# Patient Record
Sex: Female | Born: 1955 | Race: Black or African American | Hispanic: No | Smoking: Never smoker
Health system: Southern US, Community
[De-identification: ages and names within clinical notes are randomized; demographics above are authoritative.]

---

## 2020-08-12 ENCOUNTER — Encounter (HOSPITAL_BASED_OUTPATIENT_CLINIC_OR_DEPARTMENT_OTHER): Payer: Self-pay | Admitting: Emergency Medicine

## 2020-08-12 ENCOUNTER — Emergency Department (HOSPITAL_BASED_OUTPATIENT_CLINIC_OR_DEPARTMENT_OTHER)
Admission: EM | Admit: 2020-08-12 | Discharge: 2020-08-12 | Disposition: A | Discharge status: Home / Self Care | Payer: Self-pay | Attending: Emergency Medicine | Admitting: Emergency Medicine

## 2020-08-12 ENCOUNTER — Other Ambulatory Visit: Payer: Self-pay

## 2020-08-12 ENCOUNTER — Emergency Department (HOSPITAL_BASED_OUTPATIENT_CLINIC_OR_DEPARTMENT_OTHER): Payer: Self-pay

## 2020-08-12 DIAGNOSIS — M25562 Pain in left knee: Secondary | ICD-10-CM | POA: Insufficient documentation

## 2020-08-12 NOTE — Discharge Instructions (Signed)
You have been seen today for knee pain. There were no acute abnormalities on the x-rays, including no sign of fracture or dislocation, however, there could be injuries to the soft tissues, such as the ligaments or tendons that are not seen on xrays.  Ibuprofen: Take 400-600 mg of ibuprofen every 6-8 hours for the next 3 days. After this time, this medication may be used as needed for pain. Take this medication with food to avoid upset stomach. Acetaminophen (generic for Tylenol): Should you continue to have additional pain while taking the ibuprofen you may add in acetaminophen as needed. Your daily total maximum amount of acetaminophen from all sources should be limited to 4000mg /day for persons without liver problems, or 2000mg /day for those with liver problems. Ice: May apply ice to the area over the next 24 hours for 15 minutes at a time to reduce swelling. Elevation: Keep the extremity elevated as often as possible to reduce pain and inflammation. Support: Wear the knee brace for support and comfort. Wear this until pain resolves.  Follow up: Follow-up with the orthopedic specialist for any further management of this issue, ideally within the next 2 weeks. Return: Return to the ED for numbness, weakness, increasing pain, overall worsening symptoms, loss of function, or if symptoms are not improving, you have tried to follow up with the orthopedic specialist, and have been unable to do so.  For prescription assistance, may try using prescription discount sites or apps, such as goodrx.com or Good Rx smart phone app.

## 2020-08-12 NOTE — ED Triage Notes (Signed)
Pt states her left knee started tingling 2 weeks ago. She states she starting having pain last Sunday and couldn't bear weight on it. Pt wearing knee brace. Knee swollen and warm to touch with no distal symptoms. Denies numbness, fevers.

## 2020-08-12 NOTE — ED Provider Notes (Signed)
[facility] EMERGENCY DEPARTMENT Provider Note   CSN: [id number] Arrival date & time: 08/12/20  9528     History Chief Complaint  Patient presents with  . Knee Pain    Alicia Wyatt is a 64 y.o. female.  HPI      Alicia Wyatt is a 64 y.o. female, patient with no diagnosed past medical history, presenting to the ED with left knee pain for at least the last 2 weeks.  She notes the pain is aching, located along the anterior and medial aspects of the knee, nonradiating from the knee, moderate to severe.  Pain and swelling improve with rest and elevation.  Denies other extremity swelling, erythema, fever, recent procedures, recent injury, numbness, weakness, shortness of breath, chest pain, or any other complaints.  History reviewed. No pertinent past medical history.  There are no problems to display for this patient.   History reviewed. No pertinent surgical history.   OB History   No obstetric history on file.     No family history on file.  Social History   Tobacco Use  . Smoking status: Not on file  Substance Use Topics  . Alcohol use: Never  . Drug use: Never    Home Medications Prior to Admission medications   Not on File    Allergies    Patient has no known allergies.  Review of Systems   Review of Systems  Constitutional: Negative for fever.  Respiratory: Negative for shortness of breath.   Cardiovascular: Negative for chest pain.  Musculoskeletal: Positive for arthralgias and joint swelling. Negative for back pain.  Skin: Negative for color change.  Neurological: Negative for weakness and numbness.    Physical Exam Updated Vital Signs BP 128/84 (BP Location: Right Arm)   Pulse 78   Temp 98.8 F (37.1 C) (Oral)   Resp 18   Ht 5\' 9"  (1.753 m)   Wt 102.1 kg   SpO2 98%   BMI 33.23 kg/m   Physical Exam Vitals and nursing note reviewed.  Constitutional:      General: She is not in acute distress.    Appearance: She is  well-developed. She is not diaphoretic.  HENT:     Head: Normocephalic and atraumatic.  Eyes:     Conjunctiva/sclera: Conjunctivae normal.  Cardiovascular:     Rate and Rhythm: Normal rate and regular rhythm.     Pulses:          Dorsalis pedis pulses are 2+ on the left side.       Posterior tibial pulses are 2+ on the left side.  Pulmonary:     Effort: Pulmonary effort is normal.  Musculoskeletal:     Cervical back: Neck supple.     Comments: Tenderness along the anterior and medial aspect of the left knee.  Very little swelling.  No erythema or increased warmth.  Patient readily allows the exam as well as range of motion of the knee.  No pain out of proportion. No laxity or deformity.  No pain, swelling, or other abnormality to the left hip or ankle.  Skin:    General: Skin is warm and dry.     Capillary Refill: Capillary refill takes less than 2 seconds.     Coloration: Skin is not pale.  Neurological:     Mental Status: She is alert.     Comments: Sensation light touch grossly intact in the left foot and lower extremity. Strength 5/5 in left ankle. Motor function adequately intact in  the left knee, though painful.  Psychiatric:        Behavior: Behavior normal.     ED Results / Procedures / Treatments   Labs (all labs ordered are listed, but only abnormal results are displayed) Labs Reviewed - No data to display  EKG None  Radiology DG Knee Complete 4 Views Left  Result Date: 08/12/2020 CLINICAL DATA:  Knee pain. EXAM: LEFT KNEE - COMPLETE 4+ VIEW COMPARISON:  None. FINDINGS: No evidence of acute fracture or dislocation. Small joint effusion. Mild tricompartmental degenerative change. Patellar enthesophytes. IMPRESSION: 1. No evidence of acute fracture or malalignment. 2. Small joint effusion. 3. Mild tricompartmental degenerative change. Electronically Signed   By: Feliberto Harts MD   On: 08/12/2020 10:12    Procedures Procedures (including critical care  time)  Medications Ordered in ED Medications - No data to display  ED Course  I have reviewed the triage vital signs and the nursing notes.  Pertinent labs & imaging results that were available during my care of the patient were reviewed by me and considered in my medical decision making (see chart for details).    MDM Rules/Calculators/A&P                          Patient presents with at least 2 weeks of left knee pain. My suspicion for emergent pathology, such as septic arthritis, is low based on physical exam, duration of complaint, and lack of risk factors. Orthopedic follow-up. The patient was given instructions for home care as well as return precautions. Patient voices understanding of these instructions, accepts the plan, and is comfortable with discharge.  I reviewed and interpreted the patient's radiological studies.  Final Clinical Impression(s) / ED Diagnoses Final diagnoses:  Acute pain of left knee    Rx / DC Orders ED Discharge Orders    None       Concepcion Living 08/12/20 1052    Tilden Fossa, MD 08/12/20 1204

## 2020-08-18 ENCOUNTER — Telehealth: Payer: Self-pay | Admitting: Family Medicine

## 2020-08-18 NOTE — Telephone Encounter (Signed)
Called to offer pt ED f/u on left knee pain --left message as there was no answer.  FYI  -glh

## 2020-08-26 ENCOUNTER — Encounter: Payer: Self-pay | Admitting: Family Medicine

## 2020-08-26 ENCOUNTER — Ambulatory Visit (INDEPENDENT_AMBULATORY_CARE_PROVIDER_SITE_OTHER): Payer: Self-pay | Admitting: Family Medicine

## 2020-08-26 ENCOUNTER — Ambulatory Visit: Payer: Self-pay

## 2020-08-26 ENCOUNTER — Other Ambulatory Visit: Payer: Self-pay

## 2020-08-26 VITALS — BP 126/86 | HR 92 | Ht 70.0 in | Wt 210.0 lb

## 2020-08-26 DIAGNOSIS — M1712 Unilateral primary osteoarthritis, left knee: Secondary | ICD-10-CM

## 2020-08-26 MED ORDER — TRIAMCINOLONE ACETONIDE 40 MG/ML IJ SUSP
40.0000 mg | Freq: Once | INTRAMUSCULAR | Status: AC
  Administered 2020-08-26: 40 mg via INTRA_ARTICULAR

## 2020-08-26 NOTE — Addendum Note (Signed)
Addended by: Kathi Simpers F on: 08/26/2020 04:05 PM   Modules accepted: Orders

## 2020-08-26 NOTE — Progress Notes (Signed)
Alicia Wyatt - 64 y.o. female MRN [id number]  Date of birth: 02-27-1956  SUBJECTIVE:  Including CC & ROS.  No chief complaint on file.   Alicia Wyatt is a 64 y.o. female that is presenting with severe left knee pain.  The pain has been ongoing for a few weeks.  No improvement with modalities today.  No history of similar pain.  Pain is occurring over the medial joint space.  No history of surgery..  Independent review left knee x-ray from 10/14 shows no acute changes.   Review of Systems See HPI   HISTORY: Past Medical, Surgical, Social, and Family History Reviewed & Updated per EMR.   Pertinent Historical Findings include:  No past medical history on file.  No past surgical history on file.  No family history on file.  Social History   Socioeconomic History  . Marital status: Widowed    Spouse name: Not on file  . Number of children: Not on file  . Years of education: Not on file  . Highest education level: Not on file  Occupational History  . Not on file  Tobacco Use  . Smoking status: Not on file  Substance and Sexual Activity  . Alcohol use: Never  . Drug use: Never  . Sexual activity: Never  Other Topics Concern  . Not on file  Social History Narrative  . Not on file   Social Determinants of Health   Financial Resource Strain:   . Difficulty of Paying Living Expenses: Not on file  Food Insecurity:   . Worried About Programme researcher, broadcasting/film/video in the Last Year: Not on file  . Ran Out of Food in the Last Year: Not on file  Transportation Needs:   . Lack of Transportation (Medical): Not on file  . Lack of Transportation (Non-Medical): Not on file  Physical Activity:   . Days of Exercise per Week: Not on file  . Minutes of Exercise per Session: Not on file  Stress:   . Feeling of Stress : Not on file  Social Connections:   . Frequency of Communication with Friends and Family: Not on file  . Frequency of Social Gatherings with Friends and Family: Not on file  .  Attends Religious Services: Not on file  . Active Member of Clubs or Organizations: Not on file  . Attends Banker Meetings: Not on file  . Marital Status: Not on file  Intimate Partner Violence:   . Fear of Current or Ex-Partner: Not on file  . Emotionally Abused: Not on file  . Physically Abused: Not on file  . Sexually Abused: Not on file     PHYSICAL EXAM:  VS: There were no vitals taken for this visit. Physical Exam Gen: NAD, alert, cooperative with exam, well-appearing MSK:  Left knee: Normal range of motion. No obvious swelling. Normal strength resistance. Tenderness palpation of the medial joint space. Neurovascularly intact  Limited ultrasound: Left knee:  Moderate effusion in the suprapatellar pouch. Normal-appearing quadricep and patellar tendon. Severe medial joint space narrowing with increased hyperemia over the medial femoral condyle.  Summary: Severe degenerative changes.  Ultrasound and interpretation by Clare Gandy, MD   Aspiration/Injection Procedure Note Alicia Wyatt 1956-09-12  Procedure: Injection Indications: Left knee pain  Procedure Details Consent: Risks of procedure as well as the alternatives and risks of each were explained to the (patient/caregiver).  Consent for procedure obtained. Time Out: Verified patient identification, verified procedure, site/side was marked, verified correct patient position, special  equipment/implants available, medications/allergies/relevent history reviewed, required imaging and test results available.  Performed.  The area was cleaned with iodine and alcohol swabs.    The left knee superior lateral suprapatellar pouch was injected using 5 cc 1% lidocaine on a 21-gauge 2 inch needle.  The syringe was switched to mixture containing 1 cc's of 40 mg Kenalog and 4 cc's of 0.25% bupivacaine was injected.  Ultrasound was used. Images were obtained in long views showing the injection.     A sterile  dressing was applied.  Patient did tolerate procedure well.    ASSESSMENT & PLAN:   Primary osteoarthritis of left knee Has severe degenerative changes of the medial compartment with hyperemia over the phone as well. -Counseled on home exercise therapy and supportive care. -Injection. -Could consider as well therapy or gel injections.

## 2020-08-26 NOTE — Assessment & Plan Note (Signed)
Has severe degenerative changes of the medial compartment with hyperemia over the phone as well. -Counseled on home exercise therapy and supportive care. -Injection. -Could consider as well therapy or gel injections.

## 2020-08-26 NOTE — Patient Instructions (Signed)
Nice to meet you Please try voltaren  Please try the exercises   Please send me a message in MyChart with any questions or updates.  Please see me back in 4 weeks.   --Dr. Jordan Likes

## 2020-09-22 ENCOUNTER — Ambulatory Visit: Payer: Self-pay | Admitting: Family Medicine

## 2020-09-28 ENCOUNTER — Ambulatory Visit: Payer: Self-pay | Admitting: Family Medicine

## 2020-09-28 NOTE — Progress Notes (Deleted)
  Rodolfo Notaro - 64 y.o. female MRN [id number]  Date of birth: Nov 19, 1955  SUBJECTIVE:  Including CC & ROS.  No chief complaint on file.   Trinita Devlin is a 64 y.o. female that is  ***.  ***   Review of Systems See HPI   HISTORY: Past Medical, Surgical, Social, and Family History Reviewed & Updated per EMR.   Pertinent Historical Findings include:  No past medical history on file.  No past surgical history on file.  No family history on file.  Social History   Socioeconomic History  . Marital status: Widowed    Spouse name: Not on file  . Number of children: Not on file  . Years of education: Not on file  . Highest education level: Not on file  Occupational History  . Not on file  Tobacco Use  . Smoking status: Never Smoker  . Smokeless tobacco: Never Used  Substance and Sexual Activity  . Alcohol use: Never  . Drug use: Never  . Sexual activity: Never  Other Topics Concern  . Not on file  Social History Narrative  . Not on file   Social Determinants of Health   Financial Resource Strain:   . Difficulty of Paying Living Expenses: Not on file  Food Insecurity:   . Worried About Programme researcher, broadcasting/film/video in the Last Year: Not on file  . Ran Out of Food in the Last Year: Not on file  Transportation Needs:   . Lack of Transportation (Medical): Not on file  . Lack of Transportation (Non-Medical): Not on file  Physical Activity:   . Days of Exercise per Week: Not on file  . Minutes of Exercise per Session: Not on file  Stress:   . Feeling of Stress : Not on file  Social Connections:   . Frequency of Communication with Friends and Family: Not on file  . Frequency of Social Gatherings with Friends and Family: Not on file  . Attends Religious Services: Not on file  . Active Member of Clubs or Organizations: Not on file  . Attends Banker Meetings: Not on file  . Marital Status: Not on file  Intimate Partner Violence:   . Fear of Current or Ex-Partner:  Not on file  . Emotionally Abused: Not on file  . Physically Abused: Not on file  . Sexually Abused: Not on file     PHYSICAL EXAM:  VS: There were no vitals taken for this visit. Physical Exam Gen: NAD, alert, cooperative with exam, well-appearing MSK:  ***      ASSESSMENT & PLAN:   No problem-specific Assessment & Plan notes found for this encounter.

## 2021-12-03 IMAGING — CR DG KNEE COMPLETE 4+V*L*
4 series · 4 of 4 positions shown · non-contrast
Comparison: None.

CLINICAL DATA: Knee pain.

EXAM:
LEFT KNEE - COMPLETE 4+ VIEW

[t knee ap left]
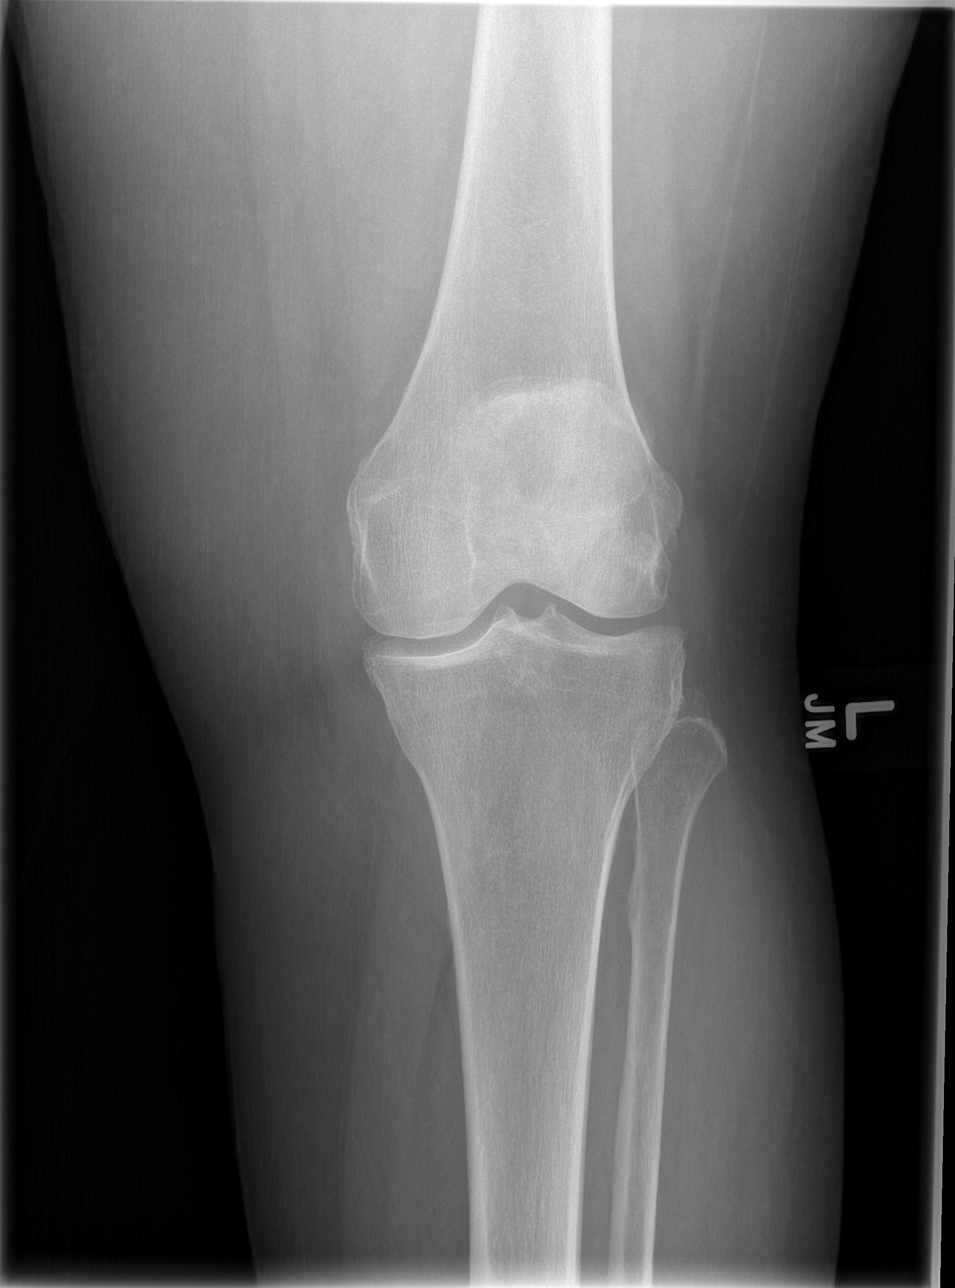

[t knee oblique left (1 of 2)]
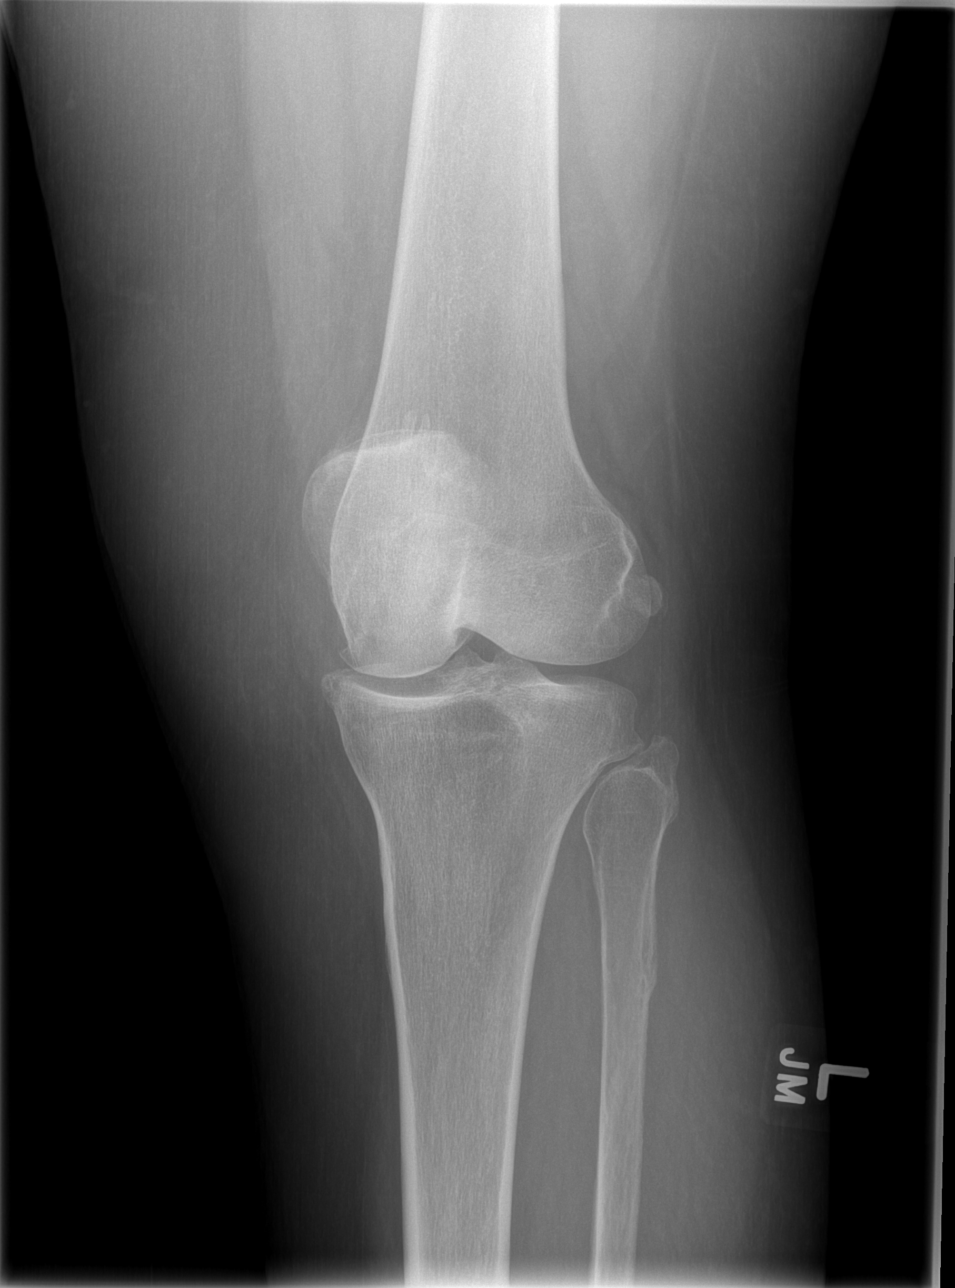

[t knee oblique left (2 of 2)]
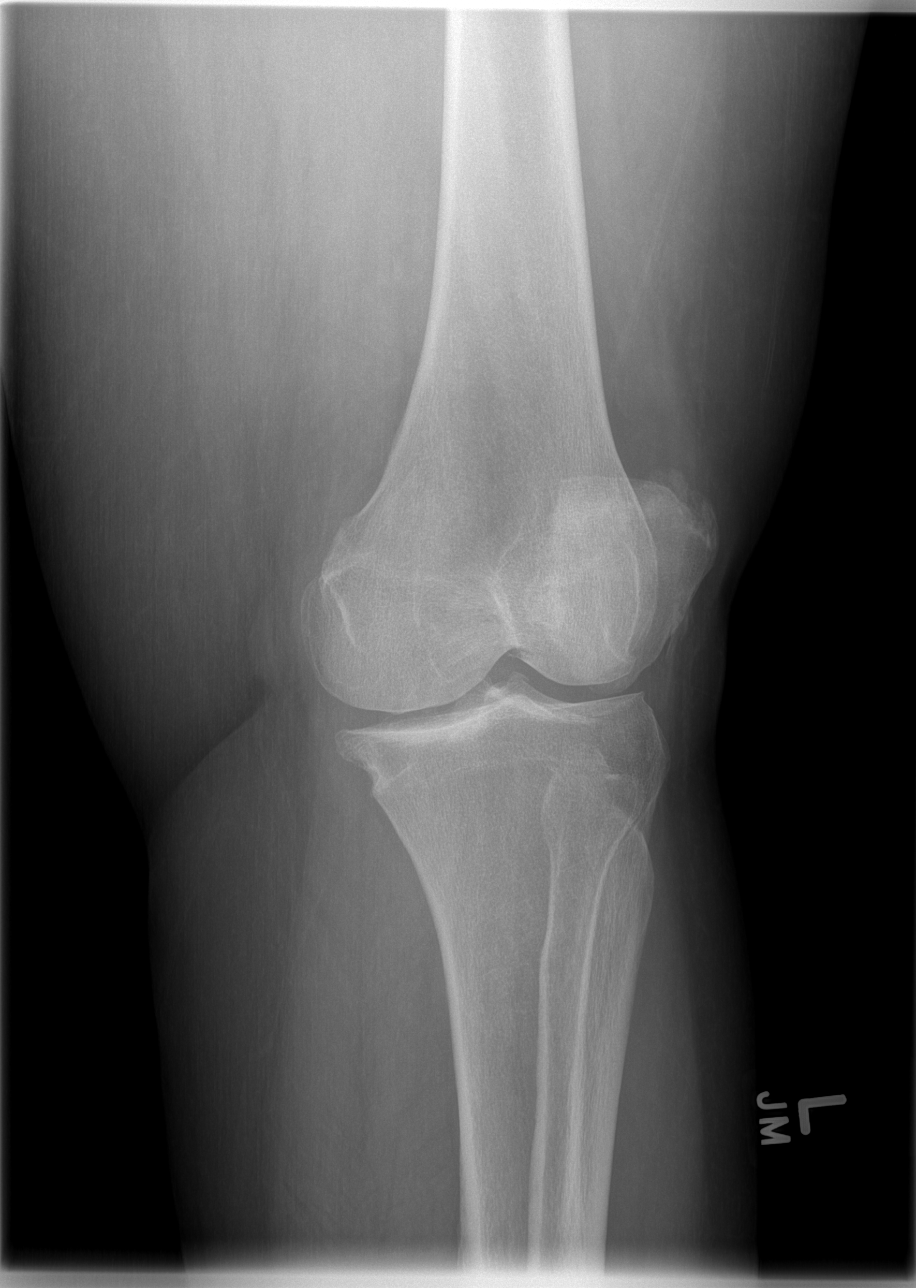

[t knee lat left]
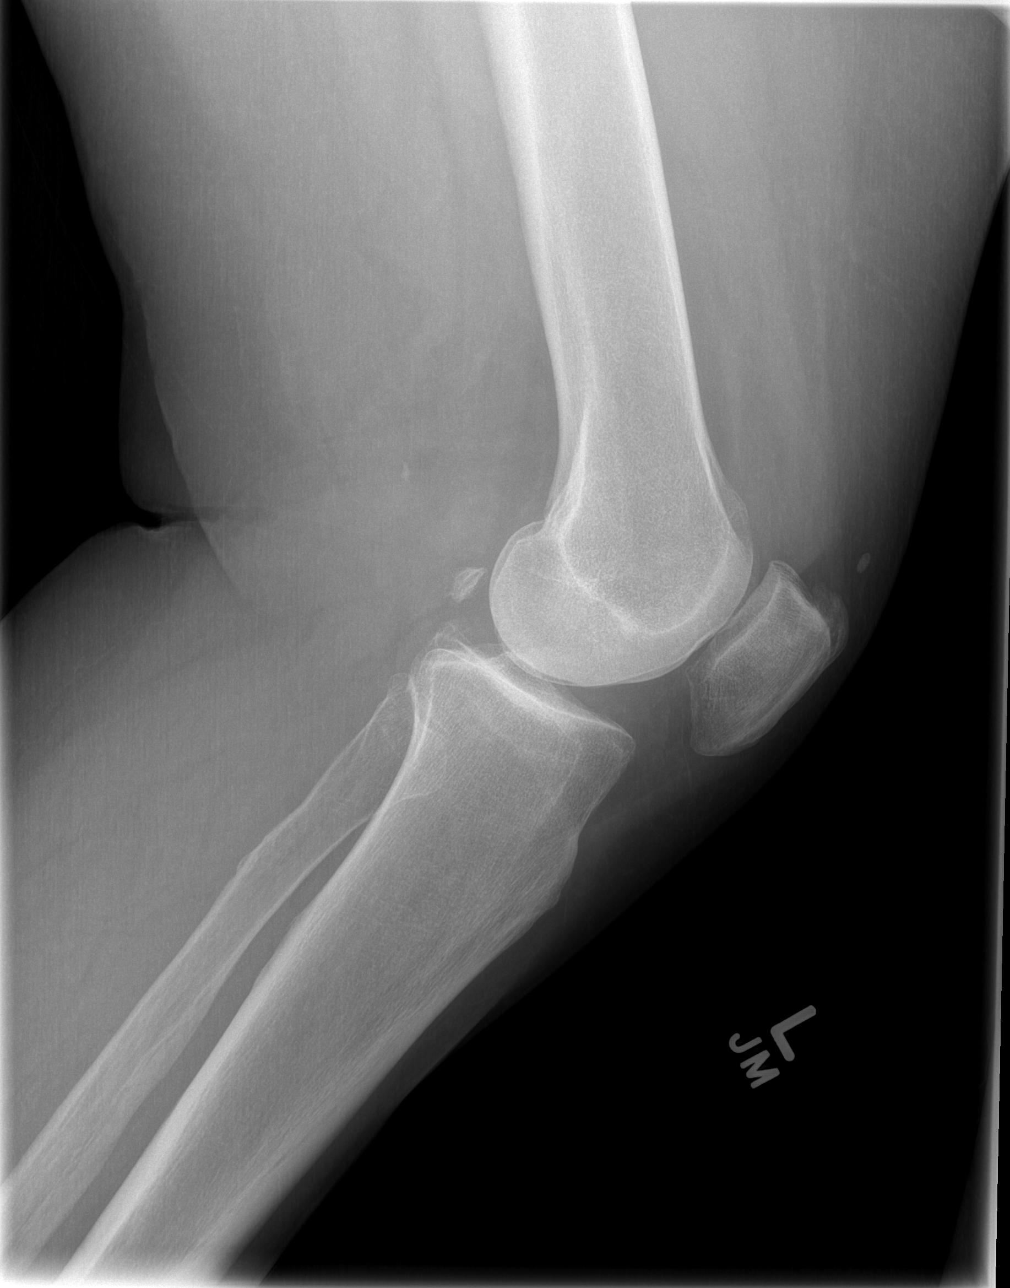

[4 of 4 positions shown; findings below may reference images not displayed]

FINDINGS: No evidence of acute fracture or dislocation. Small joint effusion.
Mild tricompartmental degenerative change. Patellar enthesophytes.
IMPRESSION: 1. No evidence of acute fracture or malalignment.
2. Small joint effusion.
3. Mild tricompartmental degenerative change.

## 2023-02-14 ENCOUNTER — Encounter: Payer: Self-pay | Admitting: *Deleted
